# Patient Record
Sex: Male | Born: 1935 | Race: White | Hispanic: No | Marital: Married | Smoking: Never smoker
Health system: Southern US, Community
[De-identification: ages and names within clinical notes are randomized; demographics above are authoritative.]

## PROBLEM LIST (undated history)

## (undated) HISTORY — PX: NEPHRECTOMY TRANSPLANTED ORGAN: SUR880

---

## 2019-08-14 ENCOUNTER — Emergency Department (INDEPENDENT_AMBULATORY_CARE_PROVIDER_SITE_OTHER): Admission: EM | Admit: 2019-08-14 | Discharge: 2019-08-14 | Payer: Medicare Other | Source: Home / Self Care

## 2019-08-14 ENCOUNTER — Emergency Department (INDEPENDENT_AMBULATORY_CARE_PROVIDER_SITE_OTHER): Payer: Medicare Other

## 2019-08-14 DIAGNOSIS — S0991XA Unspecified injury of ear, initial encounter: Secondary | ICD-10-CM

## 2019-08-14 DIAGNOSIS — S0993XA Unspecified injury of face, initial encounter: Secondary | ICD-10-CM | POA: Diagnosis not present

## 2019-08-14 DIAGNOSIS — W19XXXA Unspecified fall, initial encounter: Secondary | ICD-10-CM | POA: Diagnosis not present

## 2019-08-14 DIAGNOSIS — R42 Dizziness and giddiness: Secondary | ICD-10-CM | POA: Diagnosis not present

## 2019-08-14 NOTE — ED Triage Notes (Signed)
Pt was working on house, and fell down the steps.  Has a cut on left hand, back of head, and right ear.

## 2019-08-14 NOTE — Discharge Instructions (Addendum)
Go immediately to the ER at [facility] for evaluation of right ear injury and repair. Your CT of head is normal.

## 2019-08-14 NOTE — ED Provider Notes (Signed)
[facility]    CSN: [id number] Arrival date & time: 08/14/19  1036     History   Chief Complaint Chief Complaint  Patient presents with  . Fall  . Laceration    HPI Peter Lang is a 86 y.o. male.   HPI  Peter Lang presents for right ear injury and scalp injury following a fall down a flight down a flight of stairs about 1 hour ago while working on an old home. He was transported by his son. 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 He did not loose consciousness. Son noticed he was bleeding constantly from right ear. He denies pain. Denies problems hearing. Denies weakness, facial dropping, difficulty  speaking at point since fall, or chest pain. No past medical history on file.  There are no problems to display for this patient.   Home Medications    Prior to Admission medications   Not on File    Family History No family history on file.  Social History Social History   Tobacco Use  . Smoking status: Never Smoker  . Smokeless tobacco: Never Used  Substance Use Topics  . Alcohol use: Not Currently  . Drug use: Not Currently     Allergies   Patient has no known allergies. Review of Systems Review of Systems Pertinent negatives listed in HPI Physical Exam Triage Vital Signs ED Triage Vitals  Enc Vitals Group     BP 08/14/19 1044 (!) 147/79     Pulse Rate 08/14/19 1044 86     Resp 08/14/19 1044 20     Temp 08/14/19 1044 98.1 F (36.7 C)     Temp Source 08/14/19 1044 Oral     SpO2 08/14/19 1044 97 %     Weight 08/14/19 1042 207 lb (93.9 kg)     Height 08/14/19 1042 5\' 10"  (1.778 m)     Head Circumference --      Peak Flow --      Pain Score 08/14/19 1042 5     Pain Loc --      Pain Edu? --      Excl. in GC? --    No data found.  Updated Vital Signs BP (!) 147/79 (BP Location: Right Arm)   Pulse 86   Temp 98.1 F (36.7 C) (Oral)   Resp 20   Ht 5\' 10"  (1.778 m)   Wt  207 lb (93.9 kg)   SpO2 97%   BMI 29.70 kg/m   Visual Acuity Right Eye Distance:   Left Eye Distance:   Bilateral Distance:    Right Eye Near:   Left Eye Near:    Bilateral Near:     Physical Exam General appearance: alert, well developed, well nourished, cooperative and in no distress Head:Right ear trauma and superficial calp abrasion Respiratory: Respirations even and unlabored, normal respiratory rate Heart: rate and rhythm normal. No gallop or murmurs noted on exam  Psych: Appropriate mood and affect. Neurologic:Alert, oriented to person, place, and time, thought content appropriate. Gait intact. BUE strength 5/5.  GCS 15          UC Treatments / Results  Labs (all labs ordered are listed, but only abnormal results are displayed) Labs Reviewed - No data to display  EKG   Radiology CT Head Wo Contrast  Result Date: 08/14/2019 CLINICAL DATA:  Facial trauma.  Vertigo.  Fall. EXAM: CT HEAD WITHOUT CONTRAST TECHNIQUE: Contiguous axial images were  obtained from the base of the skull through the vertex without intravenous contrast. COMPARISON:  None. FINDINGS: Brain: Mild atrophy.  Negative for acute infarct, hemorrhage, mass Vascular: Mild atherosclerotic calcification carotid and vertebral arteries. Negative for hyperdense vessel Skull: Negative for fracture Sinuses/Orbits: Mucosal edema left maxillary sinus with bony thickening. Bilateral cataract surgery Other: Laceration right ear IMPRESSION: No acute abnormality. Electronically Signed   By: Franchot Gallo M.D.   On: 08/14/2019 11:36    Procedures Procedures (including critical care time)  Medications Ordered in UC Medications - No data to display  Initial Impression / Assessment and Plan / UC Course  I have reviewed the triage vital signs and the nursing notes.  Pertinent labs & imaging results that were available during my care of the patient were reviewed by me and considered in my medical decision making (see  chart for details).    CT of head completed to rule out any skull fracture or acute changes. Consulted with Dr. Lanny Cramp and provided photos of injury for recommendation for a repair approach. Following out discuss, patient will need plastic evaluation given the detach tissue appears to darken and may be non-viable. Patient referred to [facility]. Patient was alert and stable. Left clinic accompanied by son. Final Clinical Impressions(s) / UC Diagnoses   Final diagnoses:  Injury of right ear, initial encounter  Fall, initial encounter     Discharge Instructions     Go immediately to the ER at [facility] for evaluation of right ear injury and repair. Your CT of head is normal.    ED Prescriptions    None     PDMP not reviewed this encounter.   Scot Jun, FNP 08/16/19 [phone]

## 2020-12-29 IMAGING — CT CT HEAD W/O CM
3 of 4 series · 15 of 47 positions shown, 18 images · non-contrast
Comparison: None.

CLINICAL DATA: Facial trauma.  Vertigo.  Fall.

EXAM:
CT HEAD WITHOUT CONTRAST
TECHNIQUE: Contiguous axial images were obtained from the base of the skull
through the vertex without intravenous contrast.

[Series 2: head wo · axial · 0.49mm/px · z∈[-187,-57]mm · 9 of 32 slices shown, 12 images]
[im 3/32  brain]
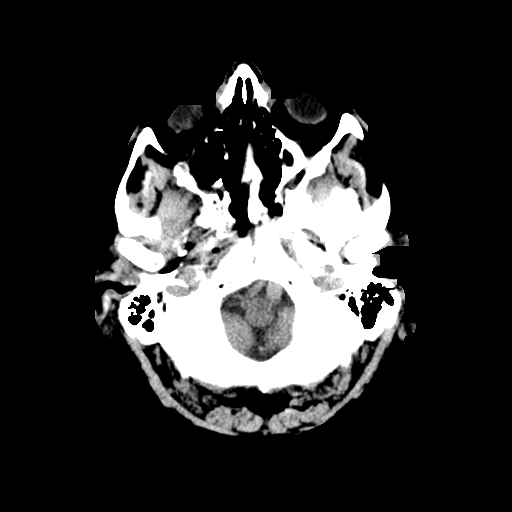
[im 3/32  bone]
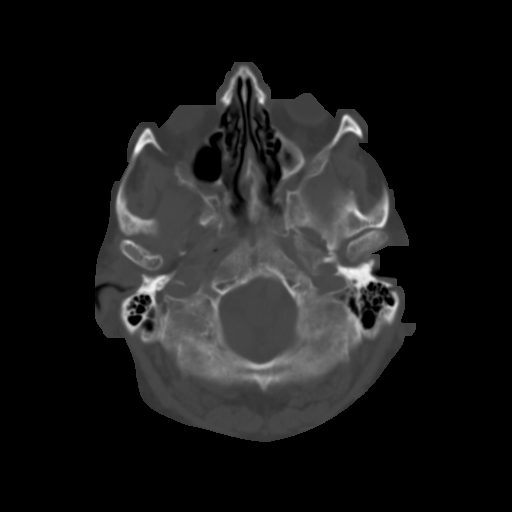
[im 7/32  brain]
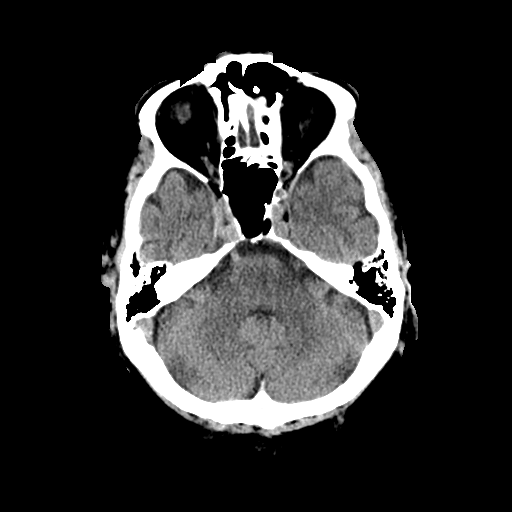
[im 9/32  brain]
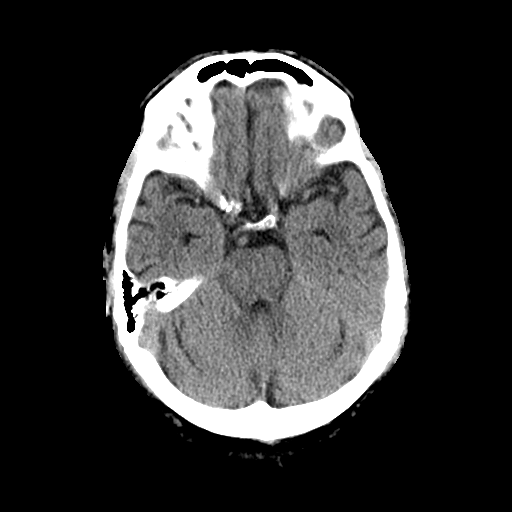
[im 14/32  brain]
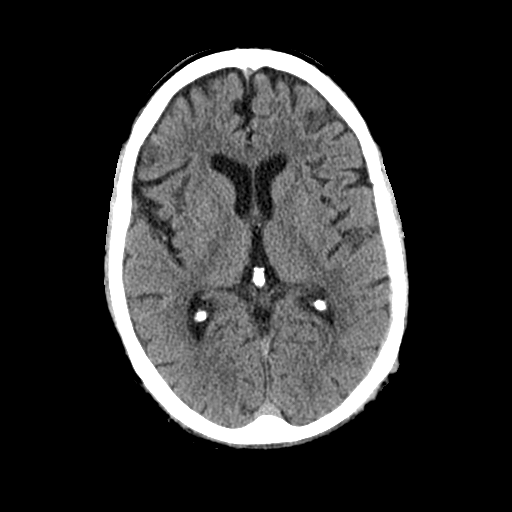
[im 16/32  brain]
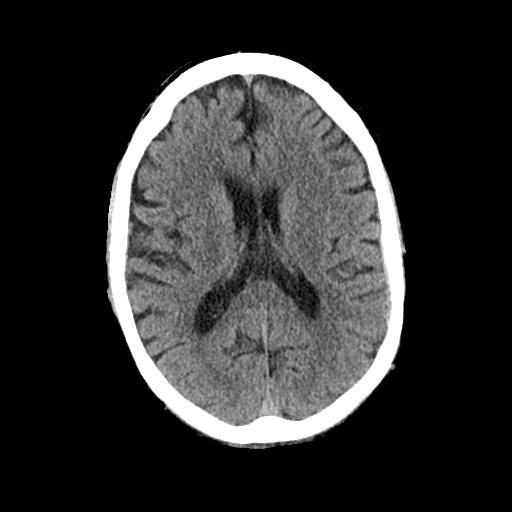
[im 16/32  bone]
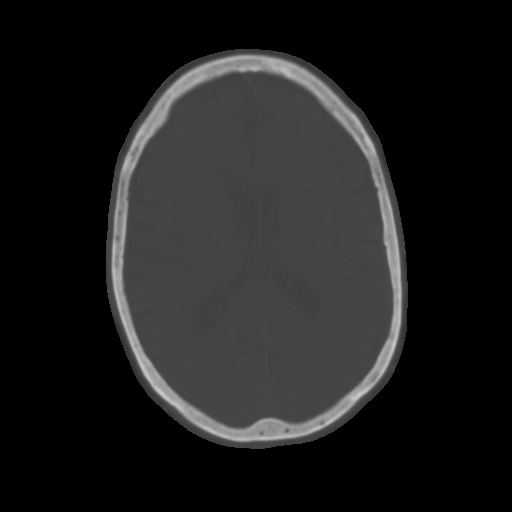
[im 18/32  brain]
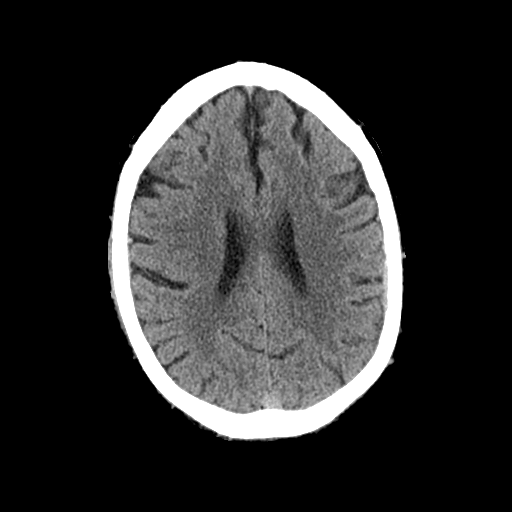
[im 23/32  brain]
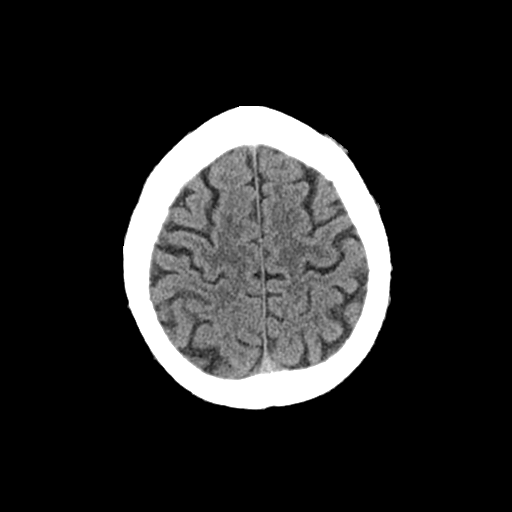
[im 25/32  brain]
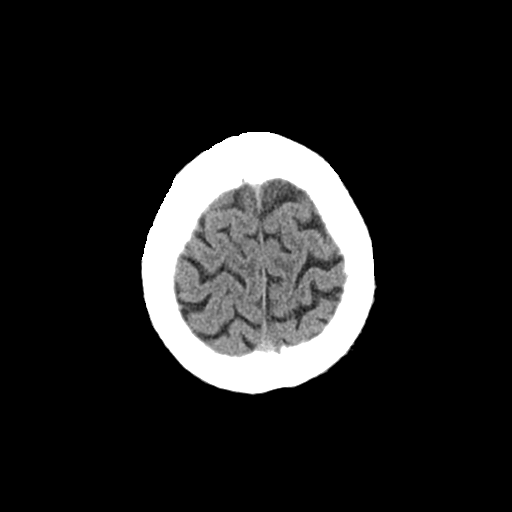
[im 29/32  brain]
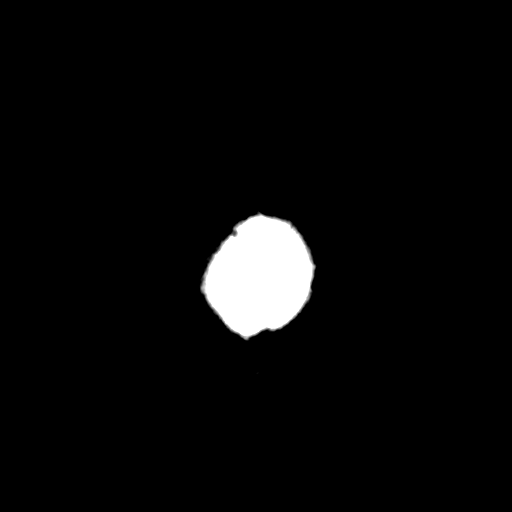
[im 29/32  bone]
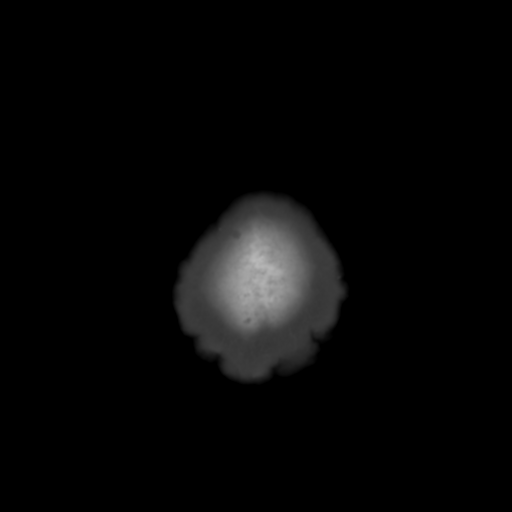

[Series 4: head wo coronal · coronal · 0.34mm/px · 3 of 74 slices shown]
[im 25/74  brain]
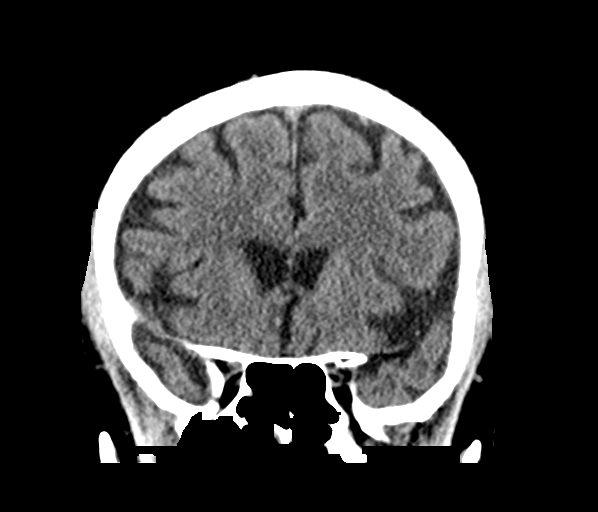
[im 33/74  brain]
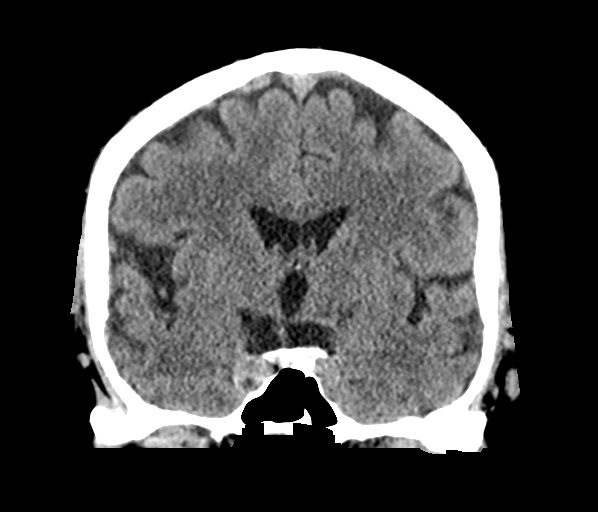
[im 41/74  brain]
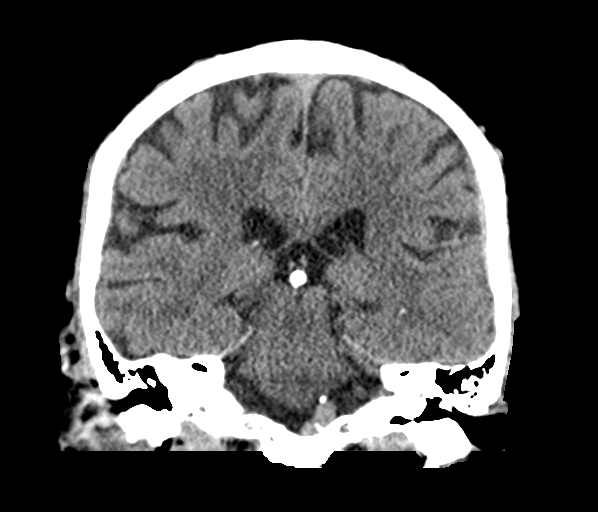

[Series 5: head wo sagittal · sagittal · 0.33mm/px · 3 of 60 slices shown]
[im 20/60  brain]
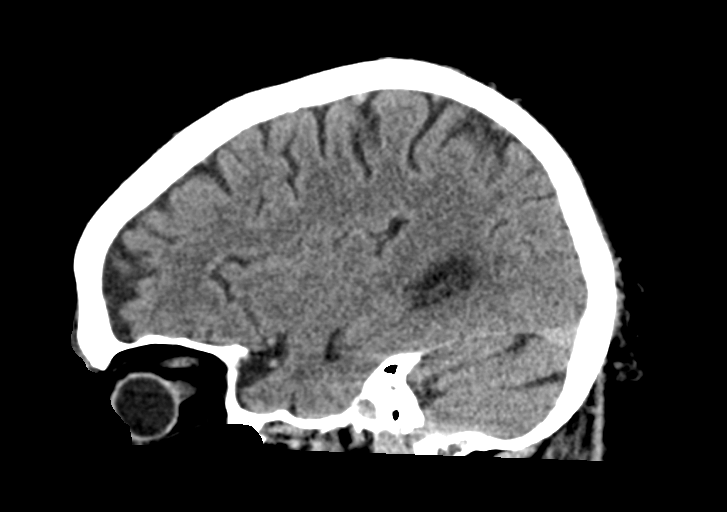
[im 30/60  brain]
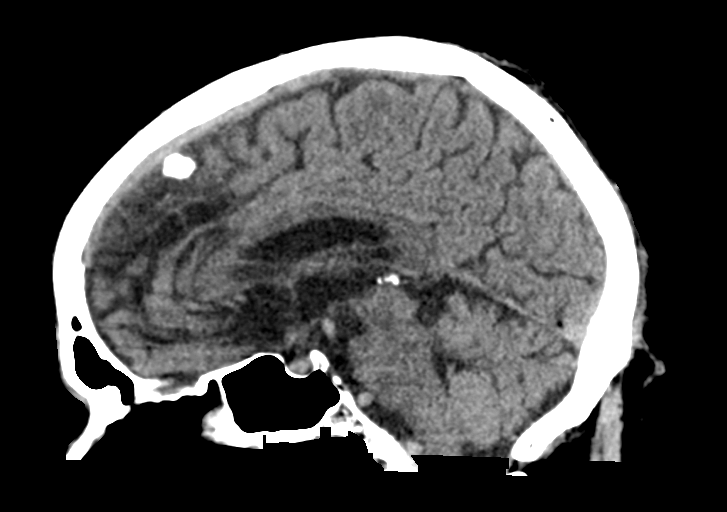
[im 40/60  brain]
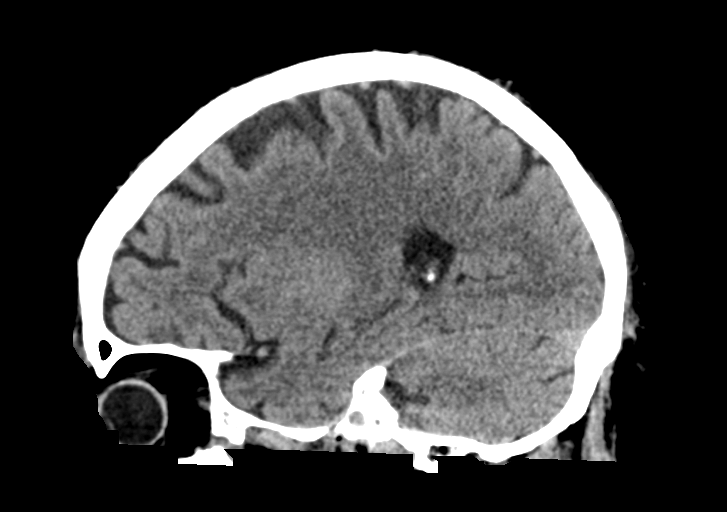

[15 of 47 positions shown; findings below may reference images not displayed]

FINDINGS: Brain: Mild atrophy.  Negative for acute infarct, hemorrhage, mass

Vascular: Mild atherosclerotic calcification carotid and vertebral
arteries. Negative for hyperdense vessel

Skull: Negative for fracture

Sinuses/Orbits: Mucosal edema left maxillary sinus with bony
thickening. Bilateral cataract surgery

Other: Laceration right ear
IMPRESSION: No acute abnormality.
